# Patient Record
Sex: Male | Born: 1950 | Race: White | Hispanic: No | Marital: Single | Smoking: Never smoker
Health system: Southern US, Community
[De-identification: ages and names within clinical notes are randomized; demographics above are authoritative.]

## PROBLEM LIST (undated history)

## (undated) DIAGNOSIS — I2510 Atherosclerotic heart disease of native coronary artery without angina pectoris: Secondary | ICD-10-CM

## (undated) DIAGNOSIS — I219 Acute myocardial infarction, unspecified: Secondary | ICD-10-CM

## (undated) DIAGNOSIS — I10 Essential (primary) hypertension: Secondary | ICD-10-CM

## (undated) HISTORY — PX: CORONARY ARTERY BYPASS GRAFT: SHX141

## (undated) HISTORY — PX: CORONARY ANGIOPLASTY WITH STENT PLACEMENT: SHX49

---

## 2015-04-21 ENCOUNTER — Emergency Department (HOSPITAL_COMMUNITY)
Admission: EM | Admit: 2015-04-21 | Discharge: 2015-04-21 | Discharge status: Against Medical Advice | Payer: Self-pay | Attending: Emergency Medicine | Admitting: Emergency Medicine

## 2015-04-21 ENCOUNTER — Encounter (HOSPITAL_COMMUNITY): Payer: Self-pay | Admitting: *Deleted

## 2015-04-21 ENCOUNTER — Emergency Department (HOSPITAL_COMMUNITY): Payer: Self-pay

## 2015-04-21 DIAGNOSIS — Z951 Presence of aortocoronary bypass graft: Secondary | ICD-10-CM | POA: Insufficient documentation

## 2015-04-21 DIAGNOSIS — I2510 Atherosclerotic heart disease of native coronary artery without angina pectoris: Secondary | ICD-10-CM | POA: Insufficient documentation

## 2015-04-21 DIAGNOSIS — Z9861 Coronary angioplasty status: Secondary | ICD-10-CM | POA: Insufficient documentation

## 2015-04-21 DIAGNOSIS — R0602 Shortness of breath: Secondary | ICD-10-CM | POA: Insufficient documentation

## 2015-04-21 DIAGNOSIS — I159 Secondary hypertension, unspecified: Secondary | ICD-10-CM | POA: Insufficient documentation

## 2015-04-21 DIAGNOSIS — R7989 Other specified abnormal findings of blood chemistry: Secondary | ICD-10-CM | POA: Insufficient documentation

## 2015-04-21 DIAGNOSIS — I10 Essential (primary) hypertension: Secondary | ICD-10-CM | POA: Insufficient documentation

## 2015-04-21 DIAGNOSIS — Z79899 Other long term (current) drug therapy: Secondary | ICD-10-CM | POA: Insufficient documentation

## 2015-04-21 DIAGNOSIS — I252 Old myocardial infarction: Secondary | ICD-10-CM | POA: Insufficient documentation

## 2015-04-21 DIAGNOSIS — R778 Other specified abnormalities of plasma proteins: Secondary | ICD-10-CM

## 2015-04-21 HISTORY — DX: Essential (primary) hypertension: I10

## 2015-04-21 HISTORY — DX: Atherosclerotic heart disease of native coronary artery without angina pectoris: I25.10

## 2015-04-21 HISTORY — DX: Acute myocardial infarction, unspecified: I21.9

## 2015-04-21 LAB — BASIC METABOLIC PANEL
Anion gap: 9 (ref 5–15)
BUN: 15 mg/dL (ref 6–20)
CALCIUM: 9.2 mg/dL (ref 8.9–10.3)
CHLORIDE: 103 mmol/L (ref 101–111)
CO2: 27 mmol/L (ref 22–32)
CREATININE: 0.88 mg/dL (ref 0.61–1.24)
GFR calc Af Amer: >60 mL/min (ref >60)
GFR calc non Af Amer: >60 mL/min (ref >60)
GLUCOSE: 93 mg/dL (ref 65–99)
Potassium: 4 mmol/L (ref 3.5–5.1)
Sodium: 139 mmol/L (ref 135–145)

## 2015-04-21 LAB — CBC
HCT: 51.2 % (ref 39.0–52.0)
Hemoglobin: 17.5 g/dL — ABNORMAL HIGH (ref 13.0–17.0)
MCH: 31.1 pg (ref 26.0–34.0)
MCHC: 34.2 g/dL (ref 30.0–36.0)
MCV: 90.9 fL (ref 78.0–100.0)
PLATELETS: 168 10*3/uL (ref 150–400)
RBC: 5.63 MIL/uL (ref 4.22–5.81)
RDW: 12.7 % (ref 11.5–15.5)
WBC: 6.1 10*3/uL (ref 4.0–10.5)

## 2015-04-21 LAB — PROTIME-INR
INR: 0.97 (ref 0.00–1.49)
Prothrombin Time: 13.1 seconds (ref 11.6–15.2)

## 2015-04-21 LAB — TROPONIN I: TROPONIN I: 0.04 ng/mL — AB (ref <0.031)

## 2015-04-21 MED ORDER — LISINOPRIL 20 MG PO TABS
20.0000 mg | ORAL_TABLET | Freq: Once | ORAL | Status: AC
  Administered 2015-04-21: 20 mg via ORAL
  Filled 2015-04-21: qty 1

## 2015-04-21 MED ORDER — NITROGLYCERIN 2 % TD OINT
1.0000 [in_us] | TOPICAL_OINTMENT | Freq: Once | TRANSDERMAL | Status: AC
  Administered 2015-04-21: 1 [in_us] via TOPICAL
  Filled 2015-04-21: qty 1

## 2015-04-21 MED ORDER — METOPROLOL TARTRATE 100 MG PO TABS: 50.0000 mg | ORAL_TABLET | Freq: Two times a day (BID) | ORAL | Status: DC

## 2015-04-21 MED ORDER — LISINOPRIL 20 MG PO TABS
20.0000 mg | ORAL_TABLET | Freq: Once | ORAL | Status: DC
Start: 2015-04-21 — End: 2015-06-16

## 2015-04-21 MED ORDER — TRAMADOL HCL 50 MG PO TABS: 50.0000 mg | ORAL_TABLET | Freq: Four times a day (QID) | ORAL | Status: DC | PRN

## 2015-04-21 MED ORDER — HYDROCHLOROTHIAZIDE 25 MG PO TABS: 25.0000 mg | ORAL_TABLET | Freq: Every day | ORAL | Status: DC

## 2015-04-21 MED ORDER — METOPROLOL TARTRATE 1 MG/ML IV SOLN
5.0000 mg | Freq: Once | INTRAVENOUS | Status: AC
  Administered 2015-04-21: 5 mg via INTRAVENOUS
  Filled 2015-04-21: qty 5

## 2015-04-21 MED ORDER — HYDROCHLOROTHIAZIDE 25 MG PO TABS
25.0000 mg | ORAL_TABLET | Freq: Every day | ORAL | Status: DC
  Administered 2015-04-21: 25 mg via ORAL
  Filled 2015-04-21: qty 1

## 2015-04-21 NOTE — ED Notes (Signed)
Pt educated by Dr Radford Pax and this RN on risk of signing out AMA instead of being admitted. Pt is very adimate about going home.

## 2015-04-21 NOTE — Discharge Instructions (Signed)

## 2015-04-21 NOTE — ED Notes (Signed)
The pt is c/o chest heaviness with sob and feet and legs swelling for one week.  He has not had some of his meds for over a molnth.  His bp is high today also.  Cardiac disease with coronary artery stens

## 2015-04-21 NOTE — ED Notes (Signed)
Pt verbalized understanding of risk of leaving AMA and agrees. Pt is to follow up with cardiologist on Monday. Pt signed ama form. Pt is alert and oriented x 4 and ambulatory upon d/c.

## 2015-04-21 NOTE — ED Provider Notes (Signed)
CSN: [id number]     Arrival date & time 04/21/15  1558 History   First MD Initiated Contact with Patient 04/21/15 1703     Chief Complaint  Patient presents with  . Chest Pain      HPI patient presents emergency room after not being able to get his medication filled because of lack of money.  He has obtained a source of finances but will not be ready until Monday.  Has had some mild chest discomfort occasionally.  Little bit of shortness of breath.  No diaphoresis.  Patient does have a worrisome history of acute coronary syndrome and has had a cardiac arrest in the past.  Also has a stent and has had a CABG.   Past Medical History  Diagnosis Date  . Coronary artery disease   . Hypertension   . MI (myocardial infarction) [facility])    Past Surgical History  Procedure Laterality Date  . Coronary artery bypass graft    . Coronary angioplasty with stent placement     No family history on file. Social History  Substance Use Topics  . Smoking status: Never Smoker   . Smokeless tobacco: None  . Alcohol Use: No    Review of Systems  Cardiovascular: Negative for chest pain.  All other systems reviewed and are negative.     Allergies  Review of patient's allergies indicates no known allergies.  Home Medications   Prior to Admission medications   Medication Sig Start Date End Date Taking? Authorizing Provider  amLODipine (NORVASC) 5 MG tablet Take 5 mg by mouth daily.   Yes Historical Provider, MD  Multiple Vitamin (MULTIVITAMIN WITH MINERALS) TABS tablet Take 1 tablet by mouth daily.   Yes Historical Provider, MD  hydrochlorothiazide (HYDRODIURIL) 25 MG tablet Take 1 tablet (25 mg total) by mouth daily. 04/21/15   Nelva Nay, MD  lisinopril (PRINIVIL,ZESTRIL) 20 MG tablet Take 1 tablet (20 mg total) by mouth once. 04/21/15   Nelva Nay, MD  metoprolol (LOPRESSOR) 100 MG tablet Take 0.5 tablets (50 mg total) by mouth 2 (two) times daily. 04/21/15   Nelva Nay, MD  traMADol (ULTRAM)  50 MG tablet Take 1 tablet (50 mg total) by mouth every 6 (six) hours as needed. 04/21/15   Nelva Nay, MD   BP 167/121 mmHg  Pulse 71  Temp(Src) 98 F (36.7 C) (Oral)  Resp 21  Ht  (1.93 m)  Wt 272 lb 4 oz (123.492 kg)  BMI 33.15 kg/m2  SpO2 97% Physical Exam  Constitutional: He is oriented to person, place, and time. He appears well-developed and well-nourished. No distress.  HENT:  Head: Normocephalic and atraumatic.  Eyes: Pupils are equal, round, and reactive to light.  Neck: Normal range of motion.  Cardiovascular: Normal rate and intact distal pulses.   Pulmonary/Chest: No respiratory distress.  Abdominal: Normal appearance. He exhibits no distension.  Musculoskeletal: Normal range of motion.  Neurological: He is alert and oriented to person, place, and time. No cranial nerve deficit.  Skin: Skin is warm and dry. No rash noted.  Psychiatric: He has a normal mood and affect. His behavior is normal.  Nursing note and vitals reviewed.   ED Course  Procedures (including critical care time) Labs Review Labs Reviewed  CBC - Abnormal; Notable for the following:    Hemoglobin 17.5 (*)    All other components within normal limits  TROPONIN I - Abnormal; Notable for the following:    Troponin I 0.04 (*)  All other components within normal limits  BASIC METABOLIC PANEL  PROTIME-INR    Imaging Review Dg Chest 2 View  04/21/2015  CLINICAL DATA:  Heavy chest, shortness of breath for 3 weeks, RIGHT ankle swelling, hypertension, coronary disease post MI and bypass surgery EXAM: CHEST  2 VIEW COMPARISON:  None FINDINGS: Upper normal heart size post CABG. Mediastinal contours and pulmonary vascularity normal. Lungs clear. No infiltrate, pleural effusion or pneumothorax. Bones unremarkable. IMPRESSION: Post CABG. No definite acute abnormalities. Electronically Signed   By: Ulyses Southward M.D.   On: 04/21/2015 16:48   I have personally reviewed and evaluated these images and lab  results as part of my medical decision-making.   EKG Interpretation   Date/Time:  Thursday April 21 2015 16:07:34 EST Ventricular Rate:  82 PR Interval:  154 QRS Duration: 156 QT Interval:  418 QTC Calculation: 488 R Axis:   56 Text Interpretation:  Normal sinus rhythm Possible Left atrial enlargement  Left bundle branch block Abnormal ECG No previous tracing Confirmed by  Regana Kemple  MD, Daphne Karrer ([id number]) on 04/21/2015 6:37:41 PM     After treatment in the ED the patient feels back to baseline and wants to go home.  Patient was given instructions that he was at risk for acute coronary syndrome.  He understands that but he feels back to baseline he does not want to stay.  He was told that he could have a sudden arrhythmia or heart attack.  He did understand that but still signed out AGAINST MEDICAL ADVICE.  I did treat his blood pressure in the emergency room and his pressure improved and he will get the rest of his medications filled tomorrow.   MDM   Final diagnoses:  Secondary hypertension, unspecified  Troponin I above reference range        Nelva Nay, MD 04/21/15 2036

## 2015-04-25 ENCOUNTER — Ambulatory Visit: Payer: No Typology Code available for payment source | Admitting: Internal Medicine

## 2015-04-25 ENCOUNTER — Encounter: Payer: Self-pay | Admitting: Internal Medicine

## 2015-04-25 VITALS — BP 98/70 | HR 62 | Ht 76.0 in | Wt 270.0 lb

## 2015-04-25 DIAGNOSIS — Z951 Presence of aortocoronary bypass graft: Secondary | ICD-10-CM

## 2015-04-25 NOTE — Progress Notes (Signed)
Cardiology Office Note   Date:  04/25/2015   ID:  Chase Mitchell, DOB Mar 01, 1951, MRN [id number]  PCP:  No primary care provider on file.  Cardiologist:   Dietrich Pates, MD   Pt presents for continued cardiac care      History of Present Illness: Chase Mitchell is a 64 y.o. male with a history of Chest pain   Has a history of CAD (CABG in 2003)    Stent  Hx of CHF   atrial fib (s/p ablation in 2005), HL, HTN   He was seen in ER on 2.2.17 Was followed at [facility] medical and pror to that [facility]  Echo in Sept 2014 LVEF was 45 to 50%  Mod LVH  Mild MR   Cath in 2015 when admitted with MI.   LM OK  LAD 100% prox  LC OM1 100%; 60 to 70% prox cx.  RCA 100%  SVG to PDA 100%LIMA to LAD OK  LAD small D1 OK R artery to OM OK  RIMA not used   PCI of SVG to PDA attempted.  Unsuccessful    LVEF 45%   Pt says he never has any pain  Does hae SOB and pressure  Has swelling in R ankle daily   BP is aggravating  Was 217/170    Not dizzy Doesn't do statins  Working with diet   Chase Mitchell      Current Outpatient Prescriptions  Medication Sig Dispense Refill  . amLODipine (NORVASC) 5 MG tablet Take 5 mg by mouth daily.    . hydrochlorothiazide (HYDRODIURIL) 25 MG tablet Take 1 tablet (25 mg total) by mouth daily. 30 tablet 2  . lisinopril (PRINIVIL,ZESTRIL) 20 MG tablet Take 1 tablet (20 mg total) by mouth once. 30 tablet 2  . metoprolol (LOPRESSOR) 100 MG tablet Take 0.5 tablets (50 mg total) by mouth 2 (two) times daily. 60 tablet 3  . Multiple Vitamin (MULTIVITAMIN WITH MINERALS) TABS tablet Take 1 tablet by mouth daily.    . nitroGLYCERIN (NITROSTAT) 0.4 MG SL tablet Place 1 tablet (0.4 mg total) under the tongue as needed for Chest pain.    . traMADol (ULTRAM) 50 MG tablet Take 50 mg by mouth every 6 (six) hours as needed for moderate pain.     No current facility-administered medications for this visit.    Allergies:   Statins   Past Medical History  Diagnosis Date  .  Coronary artery disease   . Hypertension   . MI (myocardial infarction) [facility])     Past Surgical History  Procedure Laterality Date  . Coronary artery bypass graft    . Coronary angioplasty with stent placement       Social History:  The patient  reports that he has never smoked. He does not have any smokeless tobacco history on file. He reports that he does not drink alcohol.   Family History:  The patient's family history includes Heart attack in his father; Heart failure in his mother.    ROS:  Please see the history of present illness. All other systems are reviewed and  Negative to the above problem except as noted.    PHYSICAL EXAM: VS:  BP 98/70 mmHg  Pulse 62  Ht  (1.93 m)  Wt 270 lb (122.471 kg)  BMI 32.88 kg/m2  GEN: Well nourished, well developed, in no acute distress HEENT: normal Neck: no JVD, carotid bruits, or masses Cardiac: RRR; no murmurs, rubs,  or gallops,no edema  Respiratory:  clear to auscultation bilaterally, normal work of breathing GI: soft, nontender, nondistended, + BS  No hepatomegaly  MS: no deformity Moving all extremities   Skin: warm and dry, no rash Neuro:  Strength and sensation are intact Psych: euthymic mood, full affect   EKG:  EKG is not  ordered today.  On 2/2  SR 82 bpm  LBBB   Lipid Panel No results found for: CHOL, TRIG, HDL, CHOLHDL, VLDL, LDLCALC, LDLDIRECT    Wt Readings from Last 3 Encounters:  04/25/15 270 lb (122.471 kg)  04/21/15 272 lb 4 oz (123.492 kg)      ASSESSMENT AND PLAN:  CAD   Long history of CAD   Pt denies symptoms  Wil set up for [employer] on same regimen  2.  PAF  S/p ablation  Has not had recurrence  Off of anticoag  Will need to follow closely  3.  HL  Check fasting lipds     4.  HTN  BP is low today Follow    Disposition:   FU with me will depend on test results    Signed, Dietrich Pates, MD  04/25/2015 3:04 PM    [facility] [address] [place],  [place], [place]  [address] Phone: [phone]; Fax: [phone]

## 2015-04-25 NOTE — Patient Instructions (Signed)
Your physician recommends that you continue on your current medications as directed. Please refer to the Current Medication list given to you today. Your physician has requested that you have a lexiscan myoview. For further information please visit [email or web address] Please follow instruction sheet, as given.  Your physician recommends that you return for lab work on the same day as your lexiscan.  Follow up with your physician will depend on test results.

## 2015-04-28 ENCOUNTER — Telehealth (HOSPITAL_COMMUNITY): Payer: Self-pay

## 2015-04-28 NOTE — Telephone Encounter (Signed)
Encounter complete. 

## 2015-04-29 ENCOUNTER — Other Ambulatory Visit (INDEPENDENT_AMBULATORY_CARE_PROVIDER_SITE_OTHER): Payer: No Typology Code available for payment source | Admitting: *Deleted

## 2015-04-29 DIAGNOSIS — Z951 Presence of aortocoronary bypass graft: Secondary | ICD-10-CM

## 2015-04-29 LAB — LIPID PANEL
CHOL/HDL RATIO: 6.9 ratio — AB (ref <=5.0)
Cholesterol: 192 mg/dL (ref 125–200)
HDL: 28 mg/dL — AB (ref >=40)
LDL CALC: 126 mg/dL (ref <130)
TRIGLYCERIDES: 190 mg/dL — AB (ref <150)
VLDL: 38 mg/dL — ABNORMAL HIGH (ref <30)

## 2015-04-29 NOTE — Addendum Note (Signed)
Addended by: Tonita Phoenix on: 04/29/2015 08:29 AM   Modules accepted: Orders

## 2015-05-03 ENCOUNTER — Ambulatory Visit (HOSPITAL_COMMUNITY)
Admission: RE | Admit: 2015-05-03 | Discharge: 2015-05-03 | Disposition: A | Discharge status: Home / Self Care | Payer: No Typology Code available for payment source | Source: Ambulatory Visit | Attending: Cardiology | Admitting: Cardiology

## 2015-05-03 DIAGNOSIS — Z951 Presence of aortocoronary bypass graft: Secondary | ICD-10-CM

## 2015-05-03 DIAGNOSIS — R0602 Shortness of breath: Secondary | ICD-10-CM | POA: Insufficient documentation

## 2015-05-03 DIAGNOSIS — R079 Chest pain, unspecified: Secondary | ICD-10-CM | POA: Insufficient documentation

## 2015-05-03 DIAGNOSIS — Z6832 Body mass index (BMI) 32.0-32.9, adult: Secondary | ICD-10-CM | POA: Insufficient documentation

## 2015-05-03 DIAGNOSIS — Z8249 Family history of ischemic heart disease and other diseases of the circulatory system: Secondary | ICD-10-CM | POA: Insufficient documentation

## 2015-05-03 DIAGNOSIS — R9439 Abnormal result of other cardiovascular function study: Secondary | ICD-10-CM | POA: Insufficient documentation

## 2015-05-03 DIAGNOSIS — I10 Essential (primary) hypertension: Secondary | ICD-10-CM | POA: Insufficient documentation

## 2015-05-03 DIAGNOSIS — I517 Cardiomegaly: Secondary | ICD-10-CM | POA: Insufficient documentation

## 2015-05-03 DIAGNOSIS — E663 Overweight: Secondary | ICD-10-CM | POA: Insufficient documentation

## 2015-05-03 DIAGNOSIS — R5383 Other fatigue: Secondary | ICD-10-CM | POA: Insufficient documentation

## 2015-05-03 MED ORDER — TECHNETIUM TC 99M SESTAMIBI GENERIC - CARDIOLITE
9.8000 | Freq: Once | INTRAVENOUS | Status: AC | PRN
  Administered 2015-05-03: 9.8 via INTRAVENOUS

## 2015-05-03 MED ORDER — TECHNETIUM TC 99M SESTAMIBI GENERIC - CARDIOLITE
31.2000 | Freq: Once | INTRAVENOUS | Status: AC | PRN
  Administered 2015-05-03: 31.2 via INTRAVENOUS

## 2015-05-03 MED ORDER — REGADENOSON 0.4 MG/5ML IV SOLN
0.4000 mg | Freq: Once | INTRAVENOUS | Status: AC
  Administered 2015-05-03: 0.4 mg via INTRAVENOUS

## 2015-05-04 LAB — MYOCARDIAL PERFUSION IMAGING
CHL CUP NUCLEAR SDS: 6
CHL CUP NUCLEAR SSS: 12
CSEPPHR: 62 {beats}/min
LVDIAVOL: 158 mL
LVSYSVOL: 101 mL
Rest HR: 49 {beats}/min
SRS: 6
TID: 1.26

## 2015-05-09 ENCOUNTER — Telehealth: Payer: Self-pay | Admitting: Internal Medicine

## 2015-05-09 NOTE — Telephone Encounter (Signed)
Left message to call back  

## 2015-05-09 NOTE — Telephone Encounter (Signed)
Patient returned call.   Patient is at home.

## 2015-05-09 NOTE — Telephone Encounter (Signed)
Patient informed of stress test results. 

## 2015-05-09 NOTE — Telephone Encounter (Signed)
Pt would like stress test results from 05-03-15 please. Please call today if possible.

## 2015-05-19 ENCOUNTER — Encounter: Payer: Self-pay | Admitting: *Deleted

## 2015-05-31 ENCOUNTER — Telehealth: Payer: Self-pay | Admitting: Internal Medicine

## 2015-05-31 NOTE — Telephone Encounter (Signed)
New MSg- Chase JacobsonHelen (daughter) is calling about some of Mr. Chase Mitchell symptoms , that is changing and getting worse. Please call around 2:15 if possible , she will be at lunch at this time . Thanks

## 2015-05-31 NOTE — Telephone Encounter (Signed)
Would discuss with PCP as recommended.

## 2015-05-31 NOTE — Telephone Encounter (Signed)
Returned call to patient's daughter Chase JacobsonHelen.She stated father had a episode last week in which everything became either very bright lasted appox 10 to 15 min.Stated since then eyes are very sensitive to light and having trouble focusing everything looks very bright or very dark.Stated they read that this may be heart related.Also he is having constant pain and swelling in right ankle.Stated he was checked for gout,no gout.Also circulation checked and good circulation.She also read this may be heart related.Father has appointment with PCP this Thursday 06/02/15 to discuss these problems.Advised if symptoms get worse he needs to go to ER.Stated she would like Dr.Ross's advice.Message sent to Dr.Ross.

## 2015-06-01 NOTE — Telephone Encounter (Signed)
Called patient's daughter back to inform Dr. Tenny Crawoss recommends f/u with PCP as recommended. Daughter requests call back from Dr. Tenny Crawoss (after his PCP appointment) so she can discuss his heart problems.  She has questions.  She did not disclose any specific concerns but that Dr. Tenny Crawoss please call her back if possible.

## 2015-06-01 NOTE — Telephone Encounter (Signed)
Spoke to patients daughter Spell with vision transient  Bilateral  Still sensitive to light Also swelling only in 1 ankle  REcomm:  Carotid USN  Hx of ? TIA Echo to evalauate LVEF  Pt with history of CAD When comes in for test check BMET and BNP  May consider event monitor  Hx of afib ablation in past. Pt has appt with PCP tomorrow  Told her he needs to be examined  And assess  ? Other scans

## 2015-06-02 ENCOUNTER — Encounter: Payer: Self-pay | Admitting: Family Medicine

## 2015-06-02 ENCOUNTER — Ambulatory Visit (INDEPENDENT_AMBULATORY_CARE_PROVIDER_SITE_OTHER): Payer: No Typology Code available for payment source | Admitting: Family Medicine

## 2015-06-02 VITALS — BP 99/71 | HR 67 | Temp 98.4°F | Resp 18 | Ht 76.0 in | Wt 268.0 lb

## 2015-06-02 DIAGNOSIS — M25571 Pain in right ankle and joints of right foot: Secondary | ICD-10-CM | POA: Insufficient documentation

## 2015-06-02 DIAGNOSIS — I10 Essential (primary) hypertension: Secondary | ICD-10-CM

## 2015-06-02 DIAGNOSIS — R3589 Other polyuria: Secondary | ICD-10-CM

## 2015-06-02 DIAGNOSIS — K029 Dental caries, unspecified: Secondary | ICD-10-CM

## 2015-06-02 DIAGNOSIS — G629 Polyneuropathy, unspecified: Secondary | ICD-10-CM

## 2015-06-02 DIAGNOSIS — E669 Obesity, unspecified: Secondary | ICD-10-CM

## 2015-06-02 DIAGNOSIS — R358 Other polyuria: Secondary | ICD-10-CM

## 2015-06-02 DIAGNOSIS — H5310 Unspecified subjective visual disturbances: Secondary | ICD-10-CM | POA: Insufficient documentation

## 2015-06-02 MED ORDER — TRAMADOL HCL 50 MG PO TABS: 50.0000 mg | ORAL_TABLET | Freq: Four times a day (QID) | ORAL | Status: DC | PRN

## 2015-06-02 NOTE — Patient Instructions (Addendum)
Referral has been sent to dental services. Referral has been sent to opthalmology   Will start a trial of Tramadol for right ankle pain.    Ankle Pain Ankle pain is a common symptom. The bones, cartilage, tendons, and muscles of the ankle joint perform a lot of work each day. The ankle joint holds your body weight and allows you to move around. Ankle pain can occur on either side or back of 1 or both ankles. Ankle pain may be sharp and burning or dull and aching. There may be tenderness, stiffness, redness, or warmth around the ankle. The pain occurs more often when a person walks or puts pressure on the ankle. CAUSES  There are many reasons ankle pain can develop. It is important to work with your caregiver to identify the cause since many conditions can impact the bones, cartilage, muscles, and tendons. Causes for ankle pain include:  Injury, including a break (fracture), sprain, or strain often due to a fall, sports, or a high-impact activity.  Swelling (inflammation) of a tendon (tendonitis).  Achilles tendon rupture.  Ankle instability after repeated sprains and strains.  Poor foot alignment.  Pressure on a nerve (tarsal tunnel syndrome).  Arthritis in the ankle or the lining of the ankle.  Crystal formation in the ankle (gout or pseudogout). DIAGNOSIS  A diagnosis is based on your medical history, your symptoms, results of your physical exam, and results of diagnostic tests. Diagnostic tests may include X-ray exams or a computerized magnetic scan (magnetic resonance imaging, MRI). TREATMENT  Treatment will depend on the cause of your ankle pain and may include:  Keeping pressure off the ankle and limiting activities.  Using crutches or other walking support (a cane or brace).  Using rest, ice, compression, and elevation.  Participating in physical therapy or home exercises.  Wearing shoe inserts or special shoes.  Losing weight.  Taking medications to reduce pain or  swelling or receiving an injection.  Undergoing surgery. HOME CARE INSTRUCTIONS   Only take over-the-counter or prescription medicines for pain, discomfort, or fever as directed by your caregiver.  Put ice on the injured area.  Put ice in a plastic bag.  Place a towel between your skin and the bag.  Leave the ice on for 15-20 minutes at a time, 03-04 times a day.  Keep your leg raised (elevated) when possible to lessen swelling.  Avoid activities that cause ankle pain.  Follow specific exercises as directed by your caregiver.  Record how often you have ankle pain, the location of the pain, and what it feels like. This information may be helpful to you and your caregiver.  Ask your caregiver about returning to work or sports and whether you should drive.  Follow up with your caregiver for further examination, therapy, or testing as directed. SEEK MEDICAL CARE IF:   Pain or swelling continues or worsens beyond 1 week.  You have an oral temperature above 102 F (38.9 C).  You are feeling unwell or have chills.  You are having an increasingly difficult time with walking.  You have loss of sensation or other new symptoms.  You have questions or concerns. MAKE SURE YOU:   Understand these instructions.  Will watch your condition.  Will get help right away if you are not doing well or get worse.   This information is not intended to replace advice given to you by your health care provider. Make sure you discuss any questions you have with your health care  provider.   Document Released: 08/23/2009 Document Revised: 05/28/2011 Document Reviewed: 10/05/2014 Elsevier Interactive Patient Education [employer]2016 Elsevier Inc.

## 2015-06-02 NOTE — Progress Notes (Addendum)
Subjective:    Patient ID: Chase Mitchell, male    DOB: 12-08-1950, 65 y.o.   MRN: [id number]  HPI Chase Mitchell, a 65 year old male with a history of atrial fibrilation, CAD, MI, and hypertension presents to establish care. Chase Mitchell has lived in [place], [place], and [place]. He has not had a prirmary provider since relocating to area. He says that he was managed by Chase Mitchell prior to establishing care. He has a history of a CABG in 2003, S/P ablation in 2005. He states that he recently established care with Dr. Tereasa Coop, cardiologist on 2//08/2015.  He has a history of hypertension. He has been taking medications consistently. He follows a lowfat, low sodium diet. He does not check blood pressures at home.  Patient denies chest pain, fatigue, irregular heart beat, near-syncope, palpitations and tachypnea.  Cardiovascular risk factors include: hypertension, obesity (BMI >= 30 kg/m2) and sedentary lifestyle.  History of target organ damage: include CAD and prior myocardial infarction.   Patient's primary complaint is right ankle pain. He maintains that he has had right ankle pain since 2013.  He states that he has had imaging in the past that ruled out structural issues. He says that he has been treated with both gabapentin and lyrica in the past with minimal relief. He maintains that he was evaluated by neurologist in the past but cause of neuropathy was inconclusive at that time.  He states that the only medication that has helped with pain control has been Tramadol. Current pain intensity is 4/5.  He has not identified an inciting event. He states that pain is increased by prolonged standing, walking, and wearing shoes for long periods of time.  Current symptoms include bruising, redness, stiffness and swelling.  He also endorses periodic burning and tingling.  Aggravating symptoms: inactivity, kneeling, lateral movements and walking.  Patient has had prior ankle problems. He  says that he had Ibuprofen 2 days ago with minimal relief.   Chase Mitchell is also complaining of occasional photophobia. He states that he has occasional sensitivity to light. He states that she has not had a recently opthalmology examination.    Past Medical History  Diagnosis Date  . Coronary artery disease   . Hypertension   . MI (myocardial infarction) (HCC)    Allergies  Allergen Reactions  . Statins     Other reaction(s): Myalgia Patient does not believe in statin therapy   Past Surgical History  Procedure Laterality Date  . Coronary artery bypass graft    . Coronary angioplasty with stent placement    Review of Systems  Constitutional: Negative for fever and fatigue.  HENT: Negative.   Eyes: Positive for photophobia.  Respiratory: Negative.   Cardiovascular: Negative.   Gastrointestinal: Negative.   Endocrine: Negative for polydipsia, polyphagia and polyuria.  Genitourinary: Negative.   Musculoskeletal: Positive for myalgias (right ankle pain) and joint swelling.  Skin: Negative.   Allergic/Immunologic: Negative.   Neurological: Negative.   Hematological: Negative.   Psychiatric/Behavioral: Negative.       Objective:   Physical Exam  Constitutional: He is oriented to person, place, and time. He appears well-nourished.  HENT:  Head: Normocephalic and atraumatic.  Right Ear: External ear normal.  Left Ear: External ear normal.  Mouth/Throat: Oropharynx is clear and moist.  Eyes: Conjunctivae and EOM are normal. Pupils are equal, round, and reactive to light.  Neck: Normal range of motion.  Cardiovascular: Normal rate, regular rhythm, normal heart sounds  and intact distal pulses.   Pulses:      Dorsalis pedis pulses are 2+ on the right side, and 2+ on the left side.       Posterior tibial pulses are 2+ on the right side, and 2+ on the left side.  Normal capillary refill  Pulmonary/Chest: Effort normal and breath sounds normal.  Abdominal: Soft. Bowel sounds  are normal.  Musculoskeletal:       Right ankle: He exhibits decreased range of motion. He exhibits no swelling (per patient, no visible swelling) and no ecchymosis.  Neurological: He is alert and oriented to person, place, and time.  Skin: Skin is warm and dry.  Psychiatric: He has a normal mood and affect. His behavior is normal. Judgment and thought content normal.     BP 99/71 mmHg  Pulse 67  Temp(Src) 98.4 F (36.9 C) (Oral)  Resp 18  Ht 6\' 4"  (1.93 m)  Wt 268 lb (121.564 kg)  BMI 32.64 kg/m2 Assessment & Plan:   1. Right ankle pain I will start a trial of Tramadol for right ankle pain. Patient is complaining of chronic pain and swelling. There was no noticeable swelling on physical exam and normal range of motion. He says that he has had a number of images in the past that have not shown structural damage. I recommended further evaluation by an orthopedic specialist; however; patient went on to explain how that was not what he needed. I explained to Chase Mitchell that I do not treat chronic pain and I will not continue to prescribe Tramadol. I will send a referral to sports medicine and will review medical records as they become available.  - Sedimentation Rate - traMADol (ULTRAM) 50 MG tablet; Take 1 tablet (50 mg total) by mouth every 6 (six) hours as needed for moderate pain. Reported on 06/02/2015  Dispense: 30 tablet; Refill: 0  2. Essential hypertension Continue to take medication as previously prescribed by cardiology. Follow up as previously scheduled. The patient is asked to make an attempt to improve diet and exercise patterns to aid in medical management of this problem.  3. Neuropathy (HCC) Patient states that he has been treated for neuropathy in the past with minimal relief. He says that he knows that he does not have diabetes because he follows a low fat diet and has been tested for diabetes in the past. He says that it has been several years since he has been tested. Chase Mitchell's BMI is 3632 and he has a history of cardiac disease. I recommend a hemoglobin a1C on today. Patient is requesting a referral to neurology for further evaluation of neuropathy.  - Hemoglobin A1c  4. Polyuria - Hemoglobin A1c  5. Obesity Recommend a lowfat, low carbohydrate diet divided over 5-6 small meals, increase water intake to 6-8 glasses, and 150 minutes per week of cardiovascular exercise.    6. Subjective vision disturbance, bilateral He reports transient vision loss and sensitivity to light periodically. I will send a referral to opthalmology.  - Ambulatory referral to Ophthalmology  7. Dental caries  - Ambulatory referral to Dentistry  Routine Health Maintenance:  Recommend Hepatitis testing Recommend a routine digital rectal examination     RTC: Will schedule a follow up appointment after reviewing laboratory results. I will also review medical records as they become available   Sharad Vaneaton M, FNP     The patient was given clear instructions to go to ER or return to medical center if symptoms do  not improve, worsen or new problems develop. The patient verbalized understanding. Will notify patient with laboratory results.

## 2015-06-03 ENCOUNTER — Telehealth: Payer: Self-pay | Admitting: *Deleted

## 2015-06-03 ENCOUNTER — Telehealth: Payer: Self-pay

## 2015-06-03 DIAGNOSIS — H5310 Unspecified subjective visual disturbances: Secondary | ICD-10-CM

## 2015-06-03 DIAGNOSIS — I25810 Atherosclerosis of coronary artery bypass graft(s) without angina pectoris: Secondary | ICD-10-CM

## 2015-06-03 DIAGNOSIS — I10 Essential (primary) hypertension: Secondary | ICD-10-CM

## 2015-06-03 LAB — HEMOGLOBIN A1C
HEMOGLOBIN A1C: 6 % — AB (ref <5.7)
Mean Plasma Glucose: 126 mg/dL — ABNORMAL HIGH (ref <117)

## 2015-06-03 NOTE — Telephone Encounter (Signed)
Pricilla RifflePaula Ross V, MD at 06/01/2015 5:09 PM     Status: Signed       Expand All Collapse All   Spoke to patients daughter Spell with vision transient Bilateral Still sensitive to light Also swelling only in 1 ankle  REcomm: Carotid USN Hx of ? TIA Echo to evalauate LVEF Pt with history of CAD When comes in for test check BMET and BNP  May consider event monitor Hx of afib ablation in past. Pt has appt with PCP tomorrow Told her he needs to be examined And assess ? Other scans        Orders placed for echo, carotid doppler and labs. Will watch for appointments to be scheduled.

## 2015-06-03 NOTE — Telephone Encounter (Signed)
-----   Message from Massie MaroonLachina M Hollis, [place] sent at 06/03/2015  6:38 AM EDT ----- Regarding: lab results  Please inform patient that his hemoglobin a1C is elevated at 6.0%, which is consistent with prediabetes. Recommend a lowfat, low carbohydrate diet divided over 5-6 small meals, increase water intake to 6-8 glasses, and 150 minutes per week of cardiovascular exercise.  Please follow up in office in 6 months for a follow up of prediabetes.   Thanks ----- Message -----    From: [facility] in [facility] Interface    Sent: 06/03/2015   1:35 AM      To: Massie MaroonLachina M Hollis, FNP

## 2015-06-03 NOTE — Telephone Encounter (Signed)
Called and spoke with patient advised of elevated hgba1c and to follow china's instructions regarding low carb/lowfat diet, exercised 150 minutes weekly. Patient verbalized understanding. Thanks!

## 2015-06-04 LAB — SEDIMENTATION RATE: Sed Rate: 4 mm/hr (ref 0–20)

## 2015-06-06 ENCOUNTER — Telehealth: Payer: Self-pay | Admitting: Hematology

## 2015-06-06 NOTE — Telephone Encounter (Signed)
Patient has orange card and there are no available appointments for Neurology for orange card patients for this month. Will try to call Daughter this afternoon. Thanks!

## 2015-06-06 NOTE — Addendum Note (Signed)
Addended by: Massie MaroonHOLLIS, Pacer Dorn M on: 06/06/2015 05:21 PM   Modules accepted: Orders, SmartSet

## 2015-06-06 NOTE — Telephone Encounter (Signed)
Myriam JacobsonHelen, patient's daughter called in again about neurology referral.  I advised of Laura's note below and explained the process. / Daughter would like to know if tramadol can be provided until he is able to see a neurologist.  I advised that a prescription was given at last visit, but per caller he is taking the medication a different way. / Armeniahina please advise if patient can have additional prescription until he can be seen by neurology.

## 2015-06-06 NOTE — Telephone Encounter (Signed)
Daughter is following up in regards to referrals.  I provided her with the number for the  dental referral and ophthalmology referral.  She is asking about a neurology referral, which I did not see in the system.  Can you please follow up with daughter and advise of information for neurology referral.   Phone #[phone] and daughter will be available between 2-2:30.  Thanks

## 2015-06-15 ENCOUNTER — Other Ambulatory Visit: Payer: Self-pay | Admitting: Internal Medicine

## 2015-06-15 ENCOUNTER — Telehealth: Payer: Self-pay

## 2015-06-15 ENCOUNTER — Telehealth: Payer: Self-pay | Admitting: *Deleted

## 2015-06-15 DIAGNOSIS — G8929 Other chronic pain: Secondary | ICD-10-CM | POA: Insufficient documentation

## 2015-06-15 DIAGNOSIS — M25571 Pain in right ankle and joints of right foot: Secondary | ICD-10-CM

## 2015-06-15 MED ORDER — TRAMADOL HCL 50 MG PO TABS: 50.0000 mg | ORAL_TABLET | Freq: Four times a day (QID) | ORAL | Status: DC | PRN

## 2015-06-15 NOTE — Telephone Encounter (Signed)
Okay to refill as the pt was seen by Dr Tenny Crawoss 04/25/15.

## 2015-06-15 NOTE — Telephone Encounter (Signed)
Patient left a message on the refill voicemail requesting refills on amlodipine, hctz, and lisinopril. Patient is new to Dr Tenny Crawoss. Ok to order these for the patient?

## 2015-06-15 NOTE — Telephone Encounter (Signed)
Reviewed [place] Substance Reporting system prior to prescribing opiate medications, no inconsistencies noted. Patient and I discussed that I will not treat him for chronic pain. Patient will need to follow up with pain management for further evaluation.   Meds ordered this encounter  Medications  . traMADol (ULTRAM) 50 MG tablet    Sig: Take 1 tablet (50 mg total) by mouth every 6 (six) hours as needed for moderate pain. Reported on 06/02/2015    Dispense:  30 tablet    Refill:  0    Order Specific Question:  Supervising Provider    Answer:  Quentin AngstJEGEDE, OLUGBEMIGA E [id number]]

## 2015-06-15 NOTE — Telephone Encounter (Signed)
Refill request for Tramadol. LOV:06/02/2015. Please advise. Thanks!

## 2015-06-16 ENCOUNTER — Other Ambulatory Visit: Payer: Self-pay | Admitting: *Deleted

## 2015-06-16 MED ORDER — LISINOPRIL 20 MG PO TABS: 20.0000 mg | ORAL_TABLET | Freq: Every day | ORAL | Status: DC

## 2015-06-16 MED ORDER — HYDROCHLOROTHIAZIDE 25 MG PO TABS: 25.0000 mg | ORAL_TABLET | Freq: Every day | ORAL | Status: DC

## 2015-06-21 ENCOUNTER — Other Ambulatory Visit: Payer: No Typology Code available for payment source

## 2015-06-23 ENCOUNTER — Ambulatory Visit (HOSPITAL_COMMUNITY)
Admission: RE | Admit: 2015-06-23 | Discharge: 2015-06-23 | Disposition: A | Discharge status: Home / Self Care | Payer: No Typology Code available for payment source | Source: Ambulatory Visit | Attending: Cardiovascular Disease | Admitting: Cardiovascular Disease

## 2015-06-23 ENCOUNTER — Ambulatory Visit (HOSPITAL_BASED_OUTPATIENT_CLINIC_OR_DEPARTMENT_OTHER): Payer: No Typology Code available for payment source

## 2015-06-23 ENCOUNTER — Other Ambulatory Visit (INDEPENDENT_AMBULATORY_CARE_PROVIDER_SITE_OTHER): Payer: No Typology Code available for payment source | Admitting: *Deleted

## 2015-06-23 ENCOUNTER — Other Ambulatory Visit: Payer: Self-pay

## 2015-06-23 DIAGNOSIS — I6523 Occlusion and stenosis of bilateral carotid arteries: Secondary | ICD-10-CM | POA: Insufficient documentation

## 2015-06-23 DIAGNOSIS — I4891 Unspecified atrial fibrillation: Secondary | ICD-10-CM | POA: Insufficient documentation

## 2015-06-23 DIAGNOSIS — H5310 Unspecified subjective visual disturbances: Secondary | ICD-10-CM | POA: Insufficient documentation

## 2015-06-23 DIAGNOSIS — I25810 Atherosclerosis of coronary artery bypass graft(s) without angina pectoris: Secondary | ICD-10-CM

## 2015-06-23 DIAGNOSIS — I10 Essential (primary) hypertension: Secondary | ICD-10-CM | POA: Insufficient documentation

## 2015-06-23 DIAGNOSIS — Z951 Presence of aortocoronary bypass graft: Secondary | ICD-10-CM | POA: Insufficient documentation

## 2015-06-23 DIAGNOSIS — I2510 Atherosclerotic heart disease of native coronary artery without angina pectoris: Secondary | ICD-10-CM | POA: Insufficient documentation

## 2015-06-23 DIAGNOSIS — I252 Old myocardial infarction: Secondary | ICD-10-CM | POA: Insufficient documentation

## 2015-06-23 LAB — BASIC METABOLIC PANEL
BUN: 14 mg/dL (ref 7–25)
CALCIUM: 9.1 mg/dL (ref 8.6–10.3)
CO2: 28 mmol/L (ref 20–31)
Chloride: 99 mmol/L (ref 98–110)
Creat: 0.86 mg/dL (ref 0.70–1.25)
Glucose, Bld: 138 mg/dL — ABNORMAL HIGH (ref 65–99)
POTASSIUM: 4.4 mmol/L (ref 3.5–5.3)
SODIUM: 136 mmol/L (ref 135–146)

## 2015-06-23 LAB — BRAIN NATRIURETIC PEPTIDE: Brain Natriuretic Peptide: 34.1 pg/mL (ref <100)

## 2015-06-23 NOTE — Addendum Note (Signed)
Addended by: Tonita PhoenixBOWDEN, ROBIN K on: 06/23/2015 07:55 AM   Modules accepted: Orders

## 2015-09-11 NOTE — Progress Notes (Signed)
This encounter was created in error - please disregard.

## 2015-09-11 NOTE — Addendum Note (Signed)
Addended by: Dietrich PatesOSS, Kiana Hollar V on: 09/11/2015 08:30 AM   Modules accepted: Level of Service, SmartSet

## 2015-09-11 NOTE — Addendum Note (Signed)
Addended by: Pricilla RiffleOSS, Emaleigh Guimond V on: 09/11/2015 09:22 AM   Modules accepted: Level of Service, SmartSet

## 2015-12-07 ENCOUNTER — Ambulatory Visit: Payer: No Typology Code available for payment source | Admitting: Family Medicine

## 2016-04-07 ENCOUNTER — Other Ambulatory Visit: Payer: Self-pay | Admitting: Internal Medicine

## 2016-04-10 NOTE — Telephone Encounter (Signed)
Rx refill sent to pharmacy. 

## 2016-04-13 ENCOUNTER — Emergency Department
Admission: EM | Admit: 2016-04-13 | Discharge: 2016-04-13 | Disposition: A | Discharge status: Home / Self Care | Payer: Self-pay | Attending: Emergency Medicine | Admitting: Emergency Medicine

## 2016-04-13 ENCOUNTER — Emergency Department: Payer: Self-pay

## 2016-04-13 ENCOUNTER — Encounter: Payer: Self-pay | Admitting: Emergency Medicine

## 2016-04-13 DIAGNOSIS — I2510 Atherosclerotic heart disease of native coronary artery without angina pectoris: Secondary | ICD-10-CM | POA: Insufficient documentation

## 2016-04-13 DIAGNOSIS — Y939 Activity, unspecified: Secondary | ICD-10-CM | POA: Insufficient documentation

## 2016-04-13 DIAGNOSIS — I10 Essential (primary) hypertension: Secondary | ICD-10-CM | POA: Insufficient documentation

## 2016-04-13 DIAGNOSIS — Y999 Unspecified external cause status: Secondary | ICD-10-CM | POA: Insufficient documentation

## 2016-04-13 DIAGNOSIS — W010XXA Fall on same level from slipping, tripping and stumbling without subsequent striking against object, initial encounter: Secondary | ICD-10-CM | POA: Insufficient documentation

## 2016-04-13 DIAGNOSIS — Y92481 Parking lot as the place of occurrence of the external cause: Secondary | ICD-10-CM | POA: Insufficient documentation

## 2016-04-13 DIAGNOSIS — Z79899 Other long term (current) drug therapy: Secondary | ICD-10-CM | POA: Insufficient documentation

## 2016-04-13 DIAGNOSIS — S2231XA Fracture of one rib, right side, initial encounter for closed fracture: Secondary | ICD-10-CM

## 2016-04-13 DIAGNOSIS — W009XXA Unspecified fall due to ice and snow, initial encounter: Secondary | ICD-10-CM

## 2016-04-13 DIAGNOSIS — I252 Old myocardial infarction: Secondary | ICD-10-CM | POA: Insufficient documentation

## 2016-04-13 MED ORDER — TRAMADOL HCL 50 MG PO TABS: 50.0000 mg | ORAL_TABLET | Freq: Four times a day (QID) | ORAL | 0 refills | Status: DC | PRN

## 2016-04-13 NOTE — ED Notes (Signed)
Pt with reports of mid to lower back pain that began on Wednesday - after he experienced a fall last Friday

## 2016-04-13 NOTE — Discharge Instructions (Signed)
Follow-up with [facility] if any continued problems. Tramadol every 6 hours as needed for pain. You  may not drive and take this medication as it could cause drowsiness. You may also take acetaminophen between your doses of tramadol or with  tramadol if extra pain relief is  needed.

## 2016-04-13 NOTE — ED Provider Notes (Signed)
[facility] Emergency Department Provider Note   ____________________________________________   First MD Initiated Contact with Patient 04/13/16 1314     (approximate)  I have reviewed the triage vital signs and the nursing notes.   HISTORY  Chief Complaint Chest Pain    HPI Chase SievingMark E Granquist is a 66 y.o. male is here with complaint of right posterior rib pain and scapular pain after falling [facility] during the ice and snow. Patient states he did not hit his head and there was no loss of consciousness. He is continued to be ambulatory and has been taking Tylenol at home. Patient states Tylenol has not helped any with his pain. Patient states that he coughed several times which increased his pain in his back. He is unable to be comfortable sleeping or sitting due to his right-sided shoulder/back pain. [place]  in [place] and does not have a PCP in this area. He rates his pain as a 9/10.   Past Medical History:  Diagnosis Date  . Coronary artery disease   . Hypertension   . MI (myocardial infarction)     Patient Active Problem List   Diagnosis Date Noted  . Chronic pain of right ankle 06/15/2015  . Right ankle pain 06/02/2015  . Essential hypertension 06/02/2015  . Subjective vision disturbance, bilateral 06/02/2015  . Neuropathy (HCC) 06/02/2015    Past Surgical History:  Procedure Laterality Date  . CORONARY ANGIOPLASTY WITH STENT PLACEMENT    . CORONARY ARTERY BYPASS GRAFT      Prior to Admission medications   Medication Sig Start Date End Date Taking? Authorizing Provider  amLODipine (NORVASC) 5 MG tablet TAKE 1 TABLET BY MOUTH EVERY DAY 04/10/16   Pricilla RifflePaula Ross V, MD  hydrochlorothiazide (HYDRODIURIL) 25 MG tablet Take 1 tablet (25 mg total) by mouth daily. 06/16/15   Pricilla RifflePaula Ross V, MD  lisinopril (PRINIVIL,ZESTRIL) 20 MG tablet Take 1 tablet (20 mg total) by mouth daily. 06/16/15   Pricilla RifflePaula  Ross V, MD  metoprolol (LOPRESSOR) 100 MG tablet Take 0.5 tablets (50 mg total) by mouth 2 (two) times daily. 04/21/15   Nelva Nayobert Beaton, MD  Multiple Vitamin (MULTIVITAMIN WITH MINERALS) TABS tablet Take 1 tablet by mouth daily.    Historical Provider, MD  nitroGLYCERIN (NITROSTAT) 0.4 MG SL tablet Place 1 tablet (0.4 mg total) under the tongue as needed for Chest pain. 11/30/13   Historical Provider, MD  traMADol (ULTRAM) 50 MG tablet Take 1 tablet (50 mg total) by mouth every 6 (six) hours as needed. 04/13/16   Tommi Rumpshonda L Willis Holquin, PA-C    Allergies Statins  Family History  Problem Relation Age of Onset  . Heart failure Mother   . Heart attack Father     Social History Social History  Substance Use Topics  . Smoking status: Never Smoker  . Smokeless tobacco: Never Used  . Alcohol use No    Review of Systems Constitutional: No fever/chills Eyes: No visual changes. ENT: No trauma Cardiovascular: Denies chest pain. Respiratory: Denies shortness of breath. Pain posterior ribs. Gastrointestinal: No abdominal pain.  No nausea, no vomiting.   Musculoskeletal: Positive right sided chest pain. Skin: Negative for rash. Skin as above. Neurological: Negative for headaches, focal weakness or numbness.  10-point ROS otherwise negative.  ____________________________________________   PHYSICAL EXAM:  VITAL SIGNS: ED Triage Vitals [04/13/16 1128]  Enc Vitals Group     BP 138/83     Pulse Rate  60     Resp 16     Temp 98.2 F (36.8 C)     Temp Source Oral     SpO2 97 %     Weight 245 lb (111.1 kg)     Height 6\' 4"  (1.93 m)     Head Circumference      Peak Flow      Pain Score 9     Pain Loc      Pain Edu?      Excl. in GC?     Constitutional: Alert and oriented. Well appearing and in no acute distress. Eyes: Conjunctivae are normal. PERRL. EOMI. Head: Atraumatic. Nose: No congestion/rhinnorhea. Neck: No stridor.  Nontender cervical spine on palpation  posteriorly. Cardiovascular: Normal rate, regular rhythm. Grossly normal heart sounds.  Good peripheral circulation. Respiratory: Normal respiratory effort.  No retractions. Lungs CTAB. Gastrointestinal: Soft and nontender. No distention. No abdominal bruits. No CVA tenderness. Musculoskeletal: Examination of the back there is no gross deformity. There is however marked tenderness on palpation of the right scapula distally. There is also tenderness on palpation of the posterior lateral right ribs without deformity. No crepitus is noted. Range of motion right shoulder is decreased secondary to pain. No ecchymosis or abrasions were seen. Normal gait was noted. Neurologic:  Normal speech and language. No gross focal neurologic deficits are appreciated. No gait instability. Skin:  Skin is warm, dry and intact. No rash noted. Psychiatric: Mood and affect are normal. Speech and behavior are normal.  ____________________________________________   LABS (all labs ordered are listed, but only abnormal results are displayed)  Labs Reviewed - No data to display  RADIOLOGY Right rib x-ray and right scapula x-ray per radiologist: IMPRESSION: 1. Negative right scapula. 2. Mildly displaced posterior right seventh rib fracture.  I, Tommi Rumps, personally viewed and evaluated these images (plain radiographs) as part of my medical decision making, as well as reviewing the written report by the radiologist.  ____________________________________________   PROCEDURES  Procedure(s) performed: None  Procedures  Critical Care performed: No  ____________________________________________   INITIAL IMPRESSION / ASSESSMENT AND PLAN / ED COURSE  Pertinent labs & imaging results that were available during my care of the patient were reviewed by me and considered in my medical decision making (see chart for details).  Patient was made aware that he does have a fracture of his 7th rib. He is given a  prescription for tramadol 1 every 6 hours as needed for pain. He is also to use acetaminophen with this medication to help control his pain. He is follow-up with [facility] for any continued problems.   ____________________________________________   FINAL CLINICAL IMPRESSION(S) / ED DIAGNOSES  Final diagnoses:  Closed traumatic nondisplaced fracture of one rib of right side, initial encounter  Fall due to ice or snow, initial encounter      NEW MEDICATIONS STARTED DURING THIS VISIT:  Discharge Medication List as of 04/13/2016  2:29 PM       Note:  This document was prepared using Dragon voice recognition software and may include unintentional dictation errors.    Tommi Rumps, PA-C 04/13/16 1605    Minna Antis, MD 04/14/16 1540

## 2016-04-13 NOTE — ED Triage Notes (Signed)
Patient fell on ice last Friday.  Rep[place] [employer] [place].  Pain to right axilla area began Wednesday.  No SOB/ DOE. Lung sounds CTA

## 2016-04-19 IMAGING — NM NM MISC PROCEDURE
6 series · 36 of 36 positions shown · non-contrast
Comparison: none

[Series 1: wbr rest · 6.40mm/px · 6 of 64 frames shown]
[frame 6/64]
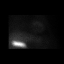
[frame 16/64]
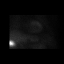
[frame 27/64]
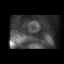
[frame 38/64]
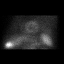
[frame 48/64]
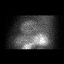
[frame 59/64]
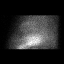

[Series 1: wbr_r-proj_st wbr rest · 6.40mm/px · 6 of 64 frames shown]
[frame 6/64]
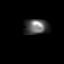
[frame 16/64]
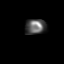
[frame 27/64]
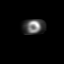
[frame 38/64]
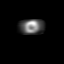
[frame 48/64]
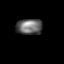
[frame 59/64]
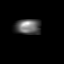

[Series 2: wbr_s-proj_st wbr stress-gsp · 6.40mm/px · 6 of 512 frames shown]
[frame 43/512]
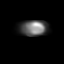
[frame 128/512]
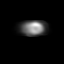
[frame 214/512]
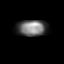
[frame 299/512]
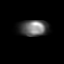
[frame 384/512]
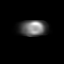
[frame 470/512]
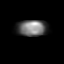

[Series 2: wbr stress-gsp · 6.40mm/px · 6 of 512 frames shown]
[frame 43/512]
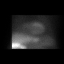
[frame 128/512]
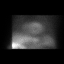
[frame 214/512]
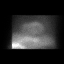
[frame 299/512]
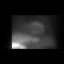
[frame 384/512]
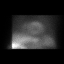
[frame 470/512]
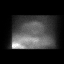

[Series 3: wbr stress-sum-em · 6.40mm/px · 6 of 64 frames shown]
[frame 6/64]
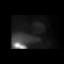
[frame 16/64]
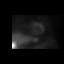
[frame 27/64]
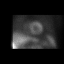
[frame 38/64]
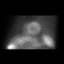
[frame 48/64]
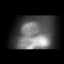
[frame 59/64]
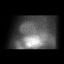

[Series 3: wbr_s-proj_st wbr stress-sum-em · 6.40mm/px · 6 of 64 frames shown]
[frame 6/64]
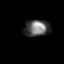
[frame 16/64]
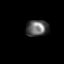
[frame 27/64]
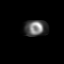
[frame 38/64]
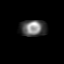
[frame 48/64]
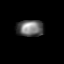
[frame 59/64]
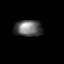

[36 of 36 positions shown; findings below may reference images not displayed]

Canned report from images found in remote index.

Refer to host system for actual result text.

## 2016-05-09 ENCOUNTER — Emergency Department
Admission: EM | Admit: 2016-05-09 | Discharge: 2016-05-09 | Disposition: A | Discharge status: Home / Self Care | Payer: No Typology Code available for payment source | Attending: Emergency Medicine | Admitting: Emergency Medicine

## 2016-05-09 ENCOUNTER — Encounter: Payer: Self-pay | Admitting: Emergency Medicine

## 2016-05-09 DIAGNOSIS — Z79899 Other long term (current) drug therapy: Secondary | ICD-10-CM | POA: Insufficient documentation

## 2016-05-09 DIAGNOSIS — K0889 Other specified disorders of teeth and supporting structures: Secondary | ICD-10-CM

## 2016-05-09 DIAGNOSIS — K047 Periapical abscess without sinus: Secondary | ICD-10-CM

## 2016-05-09 DIAGNOSIS — Z955 Presence of coronary angioplasty implant and graft: Secondary | ICD-10-CM | POA: Insufficient documentation

## 2016-05-09 DIAGNOSIS — I2510 Atherosclerotic heart disease of native coronary artery without angina pectoris: Secondary | ICD-10-CM | POA: Insufficient documentation

## 2016-05-09 DIAGNOSIS — I10 Essential (primary) hypertension: Secondary | ICD-10-CM | POA: Insufficient documentation

## 2016-05-09 DIAGNOSIS — K029 Dental caries, unspecified: Secondary | ICD-10-CM

## 2016-05-09 MED ORDER — CLINDAMYCIN PHOSPHATE 300 MG/2ML IJ SOLN
INTRAMUSCULAR | Status: AC
  Administered 2016-05-09: 600 mg via INTRAMUSCULAR
  Filled 2016-05-09: qty 2

## 2016-05-09 MED ORDER — CLINDAMYCIN PHOSPHATE 900 MG/6ML IJ SOLN
600.0000 mg | Freq: Once | INTRAMUSCULAR | Status: AC
  Administered 2016-05-09: 600 mg via INTRAMUSCULAR

## 2016-05-09 MED ORDER — CLINDAMYCIN HCL 300 MG PO CAPS: 300.0000 mg | ORAL_CAPSULE | Freq: Four times a day (QID) | ORAL | 0 refills | Status: AC

## 2016-05-09 MED ORDER — TRAMADOL HCL 50 MG PO TABS: 50.0000 mg | ORAL_TABLET | Freq: Four times a day (QID) | ORAL | 0 refills | Status: AC | PRN

## 2016-05-09 NOTE — Discharge Instructions (Signed)
Please take antibiotic's as prescribed. Use pain medication as prescribed. Follow-up with your dentist or [facility]. Return to the ER for any fevers worsening symptoms or urgent changes in her health.

## 2016-05-09 NOTE — ED Provider Notes (Signed)
[facility]-EMERGENCY DEPARTMENT Provider Note   CSN: [id number] Arrival date & time: 05/09/16  1800     History   Chief Complaint Chief Complaint  Patient presents with  . Dental Pain    HPI Chase Mitchell is a 66 y.o. male presents to the emergency department for evaluation of dental pain. He's had no pain since Thursday last week. Patient has had to cancel his previous dentist appointments for dental procedure. He has had increased pain and swelling has not been on any recent antibiotics. Denies any trouble swallowing. He has not had any fevers and denies any drainage intraorally. He is moderate to severe. Patient has appointment to see dentist on March 1.  HPI  Past Medical History:  Diagnosis Date  . Coronary artery disease   . Hypertension   . MI (myocardial infarction)     Patient Active Problem List   Diagnosis Date Noted  . Chronic pain of right ankle 06/15/2015  . Right ankle pain 06/02/2015  . Essential hypertension 06/02/2015  . Subjective vision disturbance, bilateral 06/02/2015  . Neuropathy (HCC) 06/02/2015    Past Surgical History:  Procedure Laterality Date  . CORONARY ANGIOPLASTY WITH STENT PLACEMENT    . CORONARY ARTERY BYPASS GRAFT         Home Medications    Prior to Admission medications   Medication Sig Start Date End Date Taking? Authorizing Provider  amLODipine (NORVASC) 5 MG tablet TAKE 1 TABLET BY MOUTH EVERY DAY 04/10/16   Pricilla Riffle, MD  clindamycin (CLEOCIN) 300 MG capsule Take 1 capsule (300 mg total) by mouth 4 (four) times daily. 05/09/16 05/19/16  Evon Slack, PA-C  hydrochlorothiazide (HYDRODIURIL) 25 MG tablet Take 1 tablet (25 mg total) by mouth daily. 06/16/15   Pricilla Riffle, MD  lisinopril (PRINIVIL,ZESTRIL) 20 MG tablet Take 1 tablet (20 mg total) by mouth daily. 06/16/15   Pricilla Riffle, MD  metoprolol (LOPRESSOR) 100 MG tablet Take 0.5 tablets (50 mg total) by mouth 2 (two) times daily. 04/21/15   Nelva Nay, MD  Multiple  Vitamin (MULTIVITAMIN WITH MINERALS) TABS tablet Take 1 tablet by mouth daily.    Historical Provider, MD  nitroGLYCERIN (NITROSTAT) 0.4 MG SL tablet Place 1 tablet (0.4 mg total) under the tongue as needed for Chest pain. 11/30/13   Historical Provider, MD  traMADol (ULTRAM) 50 MG tablet Take 1 tablet (50 mg total) by mouth every 6 (six) hours as needed. 05/09/16   Evon Slack, PA-C    Family History Family History  Problem Relation Age of Onset  . Heart failure Mother   . Heart attack Father     Social History Social History  Substance Use Topics  . Smoking status: Never Smoker  . Smokeless tobacco: Never Used  . Alcohol use No     Allergies   Statins   Review of Systems Review of Systems  Constitutional: Negative.  Negative for chills and fever.  HENT: Positive for dental problem and facial swelling. Negative for drooling, mouth sores, trouble swallowing and voice change.   Respiratory: Negative for chest tightness and shortness of breath.   Cardiovascular: Negative for chest pain.  Gastrointestinal: Negative for abdominal pain, diarrhea, nausea and vomiting.  Musculoskeletal: Negative for arthralgias, neck pain and neck stiffness.  Skin: Negative.   Psychiatric/Behavioral: Negative for confusion.  All other systems reviewed and are negative.    Physical Exam Updated Vital Signs BP (!) 139/91 (BP Location: Left Arm)   Pulse 85  Temp 98.5 F (36.9 C) (Oral)   Resp 18   SpO2 97%   Physical Exam  Constitutional: He is oriented to person, place, and time. He appears well-developed and well-nourished. No distress.  HENT:  Head: Normocephalic and atraumatic.  Right Ear: External ear normal.  Left Ear: External ear normal.  Nose: Nose normal.  Mouth/Throat: Uvula is midline and oropharynx is clear and moist. No oral lesions. No trismus in the jaw. Normal dentition. Dental abscesses and dental caries present. No uvula swelling.    Eyes: EOM are normal.  Neck:  Normal range of motion. Neck supple.  Cardiovascular: Normal rate.  Exam reveals no gallop and no friction rub.   No murmur heard. Pulmonary/Chest: Effort normal and breath sounds normal. No respiratory distress.  Neurological: He is alert and oriented to person, place, and time.  Skin: Skin is warm and dry.  Psychiatric: He has a normal mood and affect. His behavior is normal. Thought content normal.     ED Treatments / Results  Labs (all labs ordered are listed, but only abnormal results are displayed) Labs Reviewed - No data to display  EKG  EKG Interpretation None       Radiology No results found.  Procedures Procedures (including critical care time)  Medications Ordered in ED Medications  clindamycin (CLEOCIN) injection 600 mg (600 mg Intramuscular Given 05/09/16 1853)     Initial Impression / Assessment and Plan / ED Course  I have reviewed the triage vital signs and the nursing notes.  Pertinent labs & imaging results that were available during my care of the patient were reviewed by me and considered in my medical decision making (see chart for details).     66 year old male with dental pain, dental caries and abscess. He is placed on antibiotics. He is given medication for pain. He will follow-up with dental clinic. He is educated on signs and symptoms return the ED for.  Final Clinical Impressions(s) / ED Diagnoses   Final diagnoses:  Pain, dental  Dental caries  Dental infection    New Prescriptions New Prescriptions   CLINDAMYCIN (CLEOCIN) 300 MG CAPSULE    Take 1 capsule (300 mg total) by mouth 4 (four) times daily.   TRAMADOL (ULTRAM) 50 MG TABLET    Take 1 tablet (50 mg total) by mouth every 6 (six) hours as needed.     Evon Slackhomas C Cana Mignano, PA-C 05/09/16 1908    Governor Rooksebecca Lord, MD 05/09/16 2051

## 2016-05-09 NOTE — ED Triage Notes (Signed)
Presents with tooth pain since last Thursday  Swelling noted to left side of face

## 2016-06-28 ENCOUNTER — Other Ambulatory Visit: Payer: Self-pay | Admitting: Internal Medicine

## 2016-07-05 ENCOUNTER — Other Ambulatory Visit: Payer: Self-pay | Admitting: Internal Medicine

## 2016-08-09 ENCOUNTER — Other Ambulatory Visit: Payer: Self-pay | Admitting: Internal Medicine

## 2016-08-10 NOTE — Telephone Encounter (Signed)
Pt needs to call and schedule appointment for further refills 

## 2016-09-28 ENCOUNTER — Other Ambulatory Visit: Payer: Self-pay | Admitting: Internal Medicine

## 2016-09-28 NOTE — Telephone Encounter (Signed)
I dont see where Dr Tenny Crawoss has ever filled this for the patient. Okay to refill? He is overdue for an appointment. Please advise. Thanks, MI

## 2016-10-01 NOTE — Telephone Encounter (Signed)
Patient left a msg on the refill vm requesting refills on his medications. He states that he has a new cardiologist, [place] but [place] new patient appointment is not until 10/08/16. All but the metoprolol were refilled last week for a two week supply. Dr Tenny Crawoss has never filled the metoprolol. Okay to refill? Please advise. Thanks, MI

## 2016-10-12 ENCOUNTER — Other Ambulatory Visit: Payer: Self-pay | Admitting: Internal Medicine

## 2017-03-31 IMAGING — CR DG SCAPULA*R*
1 series · 2 of 2 positions shown · non-contrast
Comparison: None.

CLINICAL DATA: Fall on ice with continued pain. Initial encounter.

EXAM:
RIGHT SCAPULA - 2+ VIEWS

[Series 1: dg scapula right · 0.14mm/px · 2 of 2 slices shown]
[im 1/2]
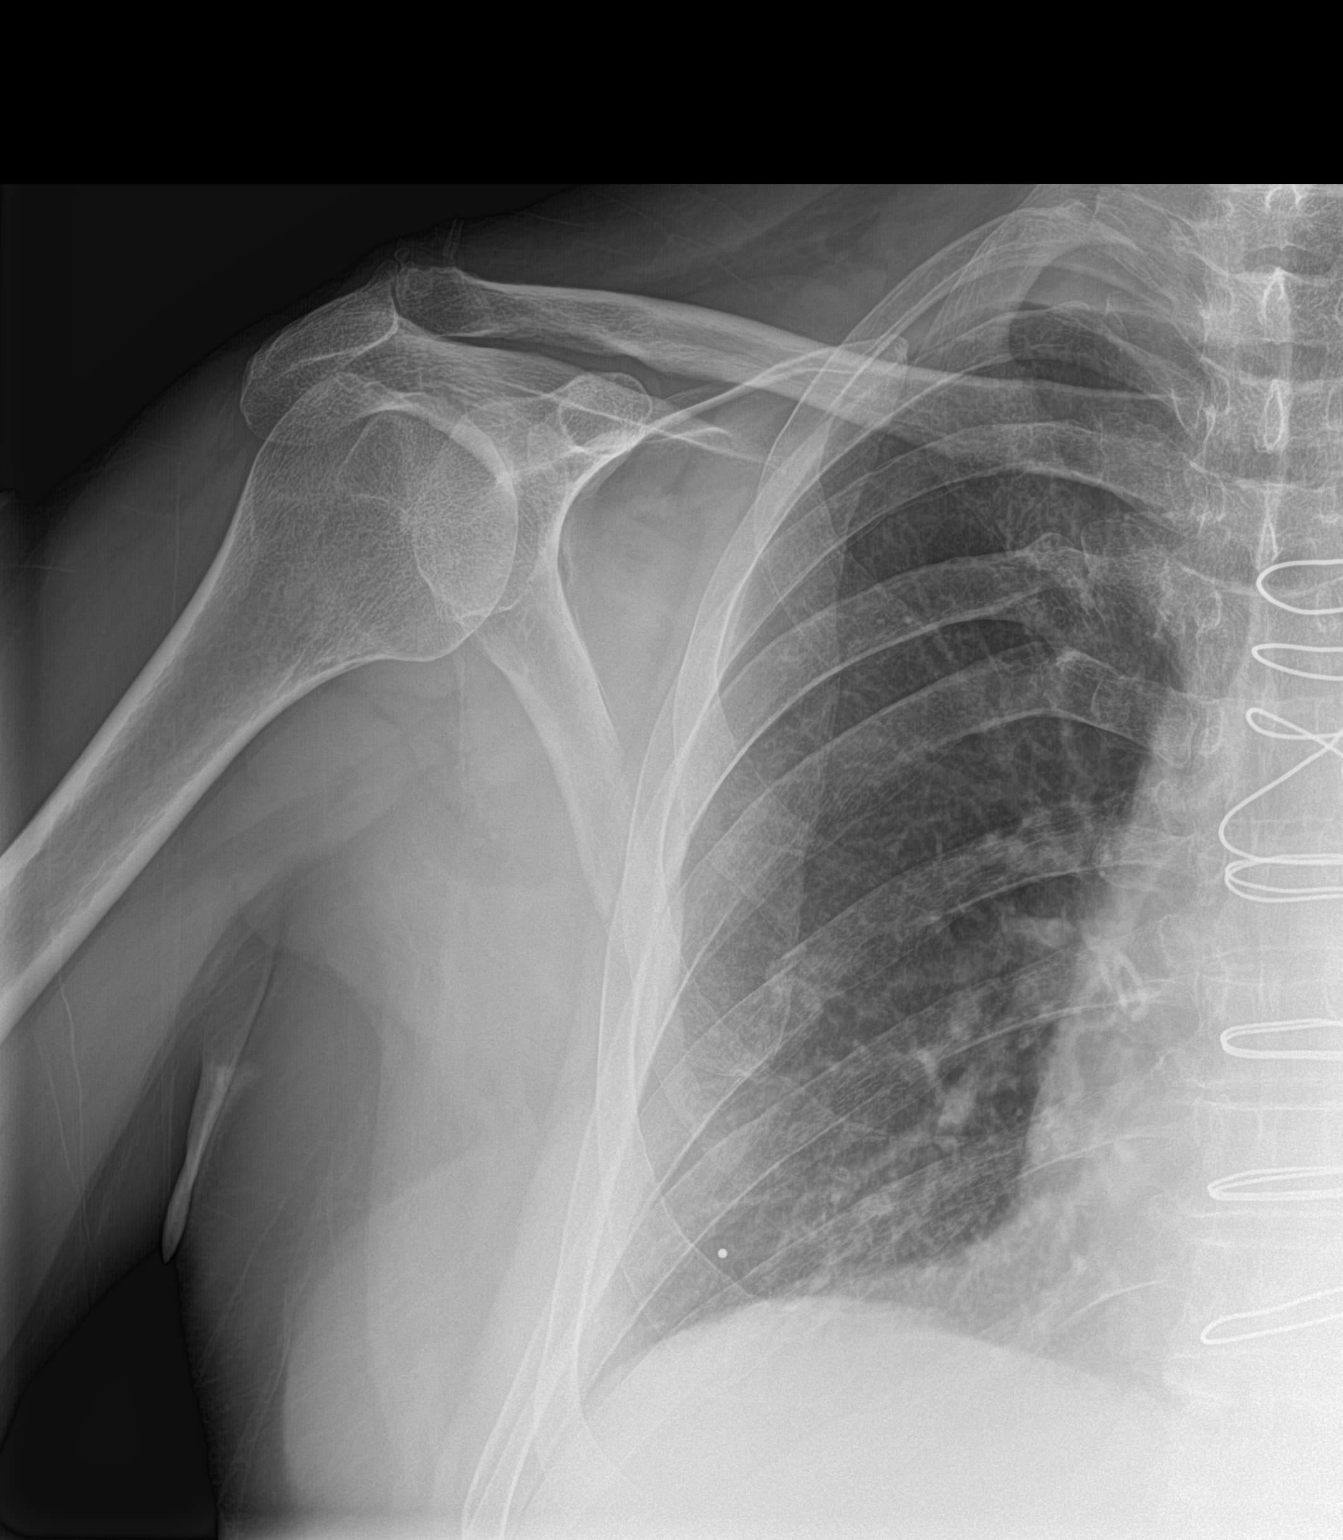
[im 2/2]
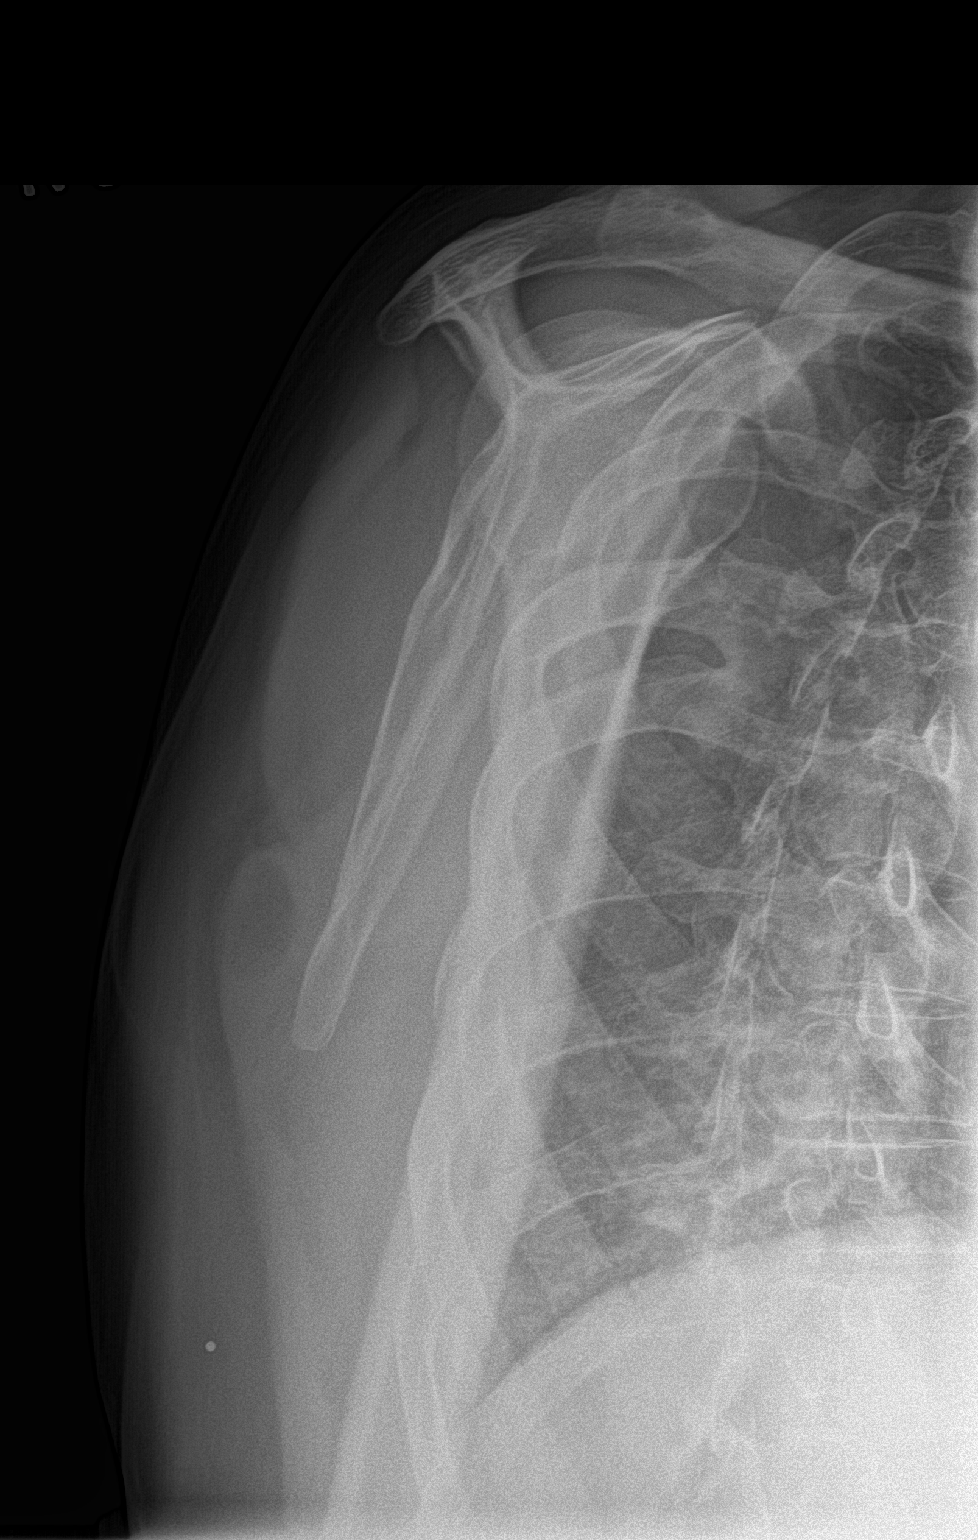

[2 of 2 positions shown; findings below may reference images not displayed]

FINDINGS: Negative for scapular fracture. Located glenohumeral and
acromioclavicular joints. Mild acromioclavicular spurring associated
with degenerative joint narrowing.

Mildly displaced posterior right seventh rib fracture.
IMPRESSION: 1. Negative right scapula.
2. Mildly displaced posterior right seventh rib fracture.
